# Patient Record
Sex: Male | Born: 2000 | Race: Black or African American | Hispanic: No | Marital: Single | Smoking: Never smoker
Health system: Southern US, Community
[De-identification: ages and names within clinical notes are randomized; demographics above are authoritative.]

---
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
## 2022-03-06 ENCOUNTER — Ambulatory Visit (INDEPENDENT_AMBULATORY_CARE_PROVIDER_SITE_OTHER): Payer: BLUE CROSS/BLUE SHIELD

## 2022-03-06 ENCOUNTER — Ambulatory Visit (HOSPITAL_COMMUNITY)
Admission: EM | Admit: 2022-03-06 | Discharge: 2022-03-06 | Disposition: A | Discharge status: Home / Self Care | Payer: BLUE CROSS/BLUE SHIELD | Attending: Internal Medicine | Admitting: Internal Medicine

## 2022-03-06 ENCOUNTER — Encounter (HOSPITAL_COMMUNITY): Payer: Self-pay

## 2022-03-06 DIAGNOSIS — M79641 Pain in right hand: Secondary | ICD-10-CM | POA: Diagnosis not present

## 2022-03-06 DIAGNOSIS — S60221A Contusion of right hand, initial encounter: Secondary | ICD-10-CM | POA: Diagnosis not present

## 2022-03-06 MED ORDER — IBUPROFEN 600 MG PO TABS
600.0000 mg | ORAL_TABLET | Freq: Once | ORAL | Status: AC
  Administered 2022-03-06: 600 mg via ORAL

## 2022-03-06 MED ORDER — IBUPROFEN 800 MG PO TABS
ORAL_TABLET | ORAL | Status: AC
  Filled 2022-03-06: qty 1

## 2022-03-06 MED ORDER — IBUPROFEN 600 MG PO TABS: 600.0000 mg | ORAL_TABLET | Freq: Four times a day (QID) | ORAL | 0 refills | Status: AC | PRN

## 2022-03-06 NOTE — ED Triage Notes (Signed)
Pt states punched a door with rt hand last night. Swelling noted, denies pain.

## 2022-03-06 NOTE — Discharge Instructions (Signed)
Your xray is normal, there is no fracture. I suspect your swelling is from a contusion of your hand. Please read the attached handout. Please ice your had three times daily for the next three days. You can take 600mg  ibuprofen every 6 hours as needed. Best to take with food. Follow up with PCP if any continued symptoms, or worsening symptoms occur within the next 7 days.

## 2022-03-06 NOTE — ED Provider Notes (Signed)
[facility]    CSN: [id number] Arrival date & time: 03/06/22  1524      History   Chief Complaint Chief Complaint  Patient presents with   Hand Injury    HPI Jared Hood is a 21 y.o. male.   Pleasant 21 year old male presents today due to concerns of right hand swelling.  He states between 11 and midnight last evening he got upset and punched a door.  He states his hand started swelling immediately.  He denies any excessive pain but states it feels tight.  He denies any decreased range of motion and reports full sensation.  He has not done any treatments to his hand.  He states the swelling is to his metacarpals on his fifth and fourth digit right hand only.   Hand Injury   History reviewed. No pertinent past medical history.  There are no problems to display for this patient.   History reviewed. No pertinent surgical history.     Home Medications    Prior to Admission medications   Medication Sig Start Date End Date Taking? Authorizing Provider  ibuprofen (ADVIL) 600 MG tablet Take 1 tablet (600 mg total) by mouth every 6 (six) hours as needed. Take with food 03/06/22  Yes Ivanna Kocak L, PA    Family History History reviewed. No pertinent family history.  Social History Social History   Tobacco Use   Smoking status: Never   Smokeless tobacco: Never  Substance Use Topics   Alcohol use: Not Currently   Drug use: Not Currently     Allergies   Patient has no known allergies.   Review of Systems Review of Systems  Musculoskeletal:  Positive for joint swelling (R hand).  All other systems reviewed and are negative.    Physical Exam Triage Vital Signs ED Triage Vitals  Enc Vitals Group     BP 03/06/22 1600 118/74     Pulse Rate 03/06/22 1600 66     Resp 03/06/22 1600 18     Temp 03/06/22 1600 98.7 F (37.1 C)     Temp Source 03/06/22 1600 Oral     SpO2 03/06/22 1600 98 %     Weight --      Height --      Head Circumference --       Peak Flow --      Pain Score 03/06/22 1601 0     Pain Loc --      Pain Edu? --      Excl. in GC? --    No data found.  Updated Vital Signs BP 118/74 (BP Location: Left Arm)   Pulse 66   Temp 98.7 F (37.1 C) (Oral)   Resp 18   SpO2 98%   Visual Acuity Right Eye Distance:   Left Eye Distance:   Bilateral Distance:    Right Eye Near:   Left Eye Near:    Bilateral Near:     Physical Exam Vitals and nursing note reviewed. Exam conducted with a chaperone present.  Constitutional:      General: He is not in acute distress.    Appearance: Normal appearance. He is normal weight. He is not ill-appearing or toxic-appearing.  Musculoskeletal:     Right forearm: Normal.     Left forearm: Normal.     Right wrist: Normal. No swelling, deformity, effusion, lacerations, tenderness, bony tenderness, snuff box tenderness or crepitus. Normal range of motion. Normal pulse.     Left wrist:  Normal.     Right hand: Swelling (without bruising or erythema to dorsal 4th/5th MCP only) present. No deformity, lacerations, tenderness or bony tenderness. Normal range of motion. Normal strength. Normal sensation. There is no disruption of two-point discrimination. Normal capillary refill. Normal pulse.     Left hand: Normal.  Neurological:     Mental Status: He is alert.      UC Treatments / Results  Labs (all labs ordered are listed, but only abnormal results are displayed) Labs Reviewed - No data to display  EKG   Radiology DG Hand Complete Right  Result Date: 03/06/2022 CLINICAL DATA:  Punched a door with right hand yesterday EXAM: RIGHT HAND - COMPLETE 3+ VIEW COMPARISON:  None Available. FINDINGS: Frontal, oblique, and lateral views of the right hand are obtained. No acute displaced fracture, subluxation, or dislocation. Joint spaces are well preserved. Soft tissues are normal. IMPRESSION: 1. Unremarkable right hand. Electronically Signed   By: Sharlet Salina M.D.   On: 03/06/2022  16:11    Procedures Procedures (including critical care time)  Medications Ordered in UC Medications  ibuprofen (ADVIL) tablet 600 mg (has no administration in time range)    Initial Impression / Assessment and Plan / UC Course  I have reviewed the triage vital signs and the nursing notes.  Pertinent labs & imaging results that were available during my care of the patient were reviewed by me and considered in my medical decision making (see chart for details).     Contusion R hand - xray negative. FROM and no bruising. Sx consistent with contusion. Ibuprofen given in office. Pt to ice his hand TID x 3 days, ibuprofen Q6 PRN pain/swelling.   Final Clinical Impressions(s) / UC Diagnoses   Final diagnoses:  Contusion of right hand, initial encounter     Discharge Instructions      Your xray is normal, there is no fracture. I suspect your swelling is from a contusion of your hand. Please read the attached handout. Please ice your had three times daily for the next three days. You can take 600mg  ibuprofen every 6 hours as needed. Best to take with food. Follow up with PCP if any continued symptoms, or worsening symptoms occur within the next 7 days.     ED Prescriptions     Medication Sig Dispense Auth. Provider   ibuprofen (ADVIL) 600 MG tablet Take 1 tablet (600 mg total) by mouth every 6 (six) hours as needed. Take with food 20 tablet Nikya Busler L, PA      PDMP not reviewed this encounter.   , Maretta Bees 03/06/22 1625

## 2024-08-29 ENCOUNTER — Other Ambulatory Visit: Payer: Self-pay

## 2024-08-29 ENCOUNTER — Emergency Department
Admission: EM | Admit: 2024-08-29 | Discharge: 2024-08-29 | Disposition: A | Discharge status: Home / Self Care | Payer: Self-pay

## 2024-08-29 ENCOUNTER — Emergency Department: Payer: Self-pay

## 2024-08-29 DIAGNOSIS — J9801 Acute bronchospasm: Secondary | ICD-10-CM | POA: Insufficient documentation

## 2024-08-29 DIAGNOSIS — R079 Chest pain, unspecified: Secondary | ICD-10-CM

## 2024-08-29 DIAGNOSIS — F17200 Nicotine dependence, unspecified, uncomplicated: Secondary | ICD-10-CM | POA: Insufficient documentation

## 2024-08-29 LAB — BASIC METABOLIC PANEL WITH GFR
Anion gap: 8 (ref 5–15)
BUN: 11 mg/dL (ref 6–20)
CO2: 30 mmol/L (ref 22–32)
Calcium: 9.6 mg/dL (ref 8.9–10.3)
Chloride: 104 mmol/L (ref 98–111)
Creatinine, Ser: 1.08 mg/dL (ref 0.61–1.24)
GFR, Estimated: >60 mL/min
Glucose, Bld: 121 mg/dL — ABNORMAL HIGH (ref 70–99)
Potassium: 4 mmol/L (ref 3.5–5.1)
Sodium: 142 mmol/L (ref 135–145)

## 2024-08-29 LAB — TROPONIN T, HIGH SENSITIVITY: Troponin T High Sensitivity: 7 ng/L (ref 0–19)

## 2024-08-29 LAB — CBC
HCT: 38.6 % — ABNORMAL LOW (ref 39.0–52.0)
Hemoglobin: 13.8 g/dL (ref 13.0–17.0)
MCH: 31.1 pg (ref 26.0–34.0)
MCHC: 35.8 g/dL (ref 30.0–36.0)
MCV: 86.9 fL (ref 80.0–100.0)
Platelets: 265 10*3/uL (ref 150–400)
RBC: 4.44 MIL/uL (ref 4.22–5.81)
RDW: 11.6 % (ref 11.5–15.5)
WBC: 5.4 10*3/uL (ref 4.0–10.5)
nRBC: 0 % (ref 0.0–0.2)

## 2024-08-29 MED ORDER — KETOROLAC TROMETHAMINE 15 MG/ML IJ SOLN
15.0000 mg | Freq: Once | INTRAMUSCULAR | Status: AC
  Administered 2024-08-29: 15 mg via INTRAVENOUS
  Filled 2024-08-29: qty 1

## 2024-08-29 MED ORDER — ALBUTEROL SULFATE HFA 108 (90 BASE) MCG/ACT IN AERS
2.0000 | INHALATION_SPRAY | Freq: Four times a day (QID) | RESPIRATORY_TRACT | 2 refills | Status: AC | PRN
  Filled 2024-08-29: qty 6.7, 25d supply, fill #0

## 2024-08-29 MED ORDER — IPRATROPIUM-ALBUTEROL 0.5-2.5 (3) MG/3ML IN SOLN
3.0000 mL | Freq: Once | RESPIRATORY_TRACT | Status: AC
  Administered 2024-08-29: 3 mL via RESPIRATORY_TRACT
  Filled 2024-08-29: qty 3

## 2024-08-29 NOTE — Discharge Instructions (Signed)

## 2024-08-29 NOTE — ED Triage Notes (Signed)
 Pt sts that he has been having chest pain for the last two days. Pt sts that the pain is mid sternal.

## 2024-08-29 NOTE — ED Provider Notes (Signed)
 "  [facility] Provider Note    Event Date/Time   First MD Initiated Contact with Patient 08/29/24 1057     (approximate)   History   Chest Pain   HPI  Jared Hood is a 24 y.o. male with a past medical history of vaping who presents with 2 days of worsening chest pain and shortness of breath.  He states that the pain is worse with deep breaths and is midsternal.  Denies any abdominal pain or nausea and vomiting.  Patient denies any syncopal episodes or any family history of early cardiac death or coronary artery disease      Physical Exam   Triage Vital Signs: ED Triage Vitals  Encounter Vitals Group     BP 08/29/24 1035 (!) 142/86     Girls Systolic BP Percentile --      Girls Diastolic BP Percentile --      Boys Systolic BP Percentile --      Boys Diastolic BP Percentile --      Pulse Rate 08/29/24 1035 84     Resp 08/29/24 1035 17     Temp 08/29/24 1035 98.8 F (37.1 C)     Temp Source 08/29/24 1035 Oral     SpO2 08/29/24 1035 97 %     Weight 08/29/24 1036 160 lb (72.6 kg)     Height 08/29/24 1036 6' 2 (1.88 m)     Head Circumference --      Peak Flow --      Pain Score 08/29/24 1035 5     Pain Loc --      Pain Education --      Exclude from Growth Chart --     Most recent vital signs: Vitals:   08/29/24 1200 08/29/24 1254  BP: 120/75 108/65  Pulse:  66  Resp: 17 18  Temp:    SpO2:  98%    Nursing Triage Note reviewed. Vital signs reviewed and patients oxygen saturation is normoxic  General: Patient is well nourished, well developed, awake and alert, resting comfortably in no acute distress Head: Normocephalic and atraumatic Eyes: Normal inspection, extraocular muscles intact, no conjunctival pallor Ear, nose, throat: Normal external exam Neck: Normal range of motion Respiratory: Patient is in no respiratory distress, lungs slight wheeze in the apices Cardiovascular: Patient is not tachycardic, RRR without murmur  appreciated GI: Abd SNT with no guarding or rebound  Back: Normal inspection of the back with good strength and range of motion throughout all ext Extremities: pulses intact with good cap refills, no LE pitting edema or calf tenderness Neuro: The patient is alert and oriented to person, place, and time, appropriately conversive, with 5/5 bilat UE/LE strength, no gross motor or sensory defects noted. Coordination appears to be adequate. Skin: Warm, dry, and intact Psych: normal mood and affect, no SI or HI  ED Results / Procedures / Treatments   Labs (all labs ordered are listed, but only abnormal results are displayed) Labs Reviewed  BASIC METABOLIC PANEL WITH GFR - Abnormal; Notable for the following components:      Result Value   Glucose, Bld 121 (*)    All other components within normal limits  CBC - Abnormal; Notable for the following components:   HCT 38.6 (*)    All other components within normal limits  TROPONIN T, HIGH SENSITIVITY  TROPONIN T, HIGH SENSITIVITY     EKG EKG and rhythm strip are interpreted by myself:   EKG: [  Normal sinus rhythm] at heart rate of 93, normal QRS duration, QTc 430, nonspecific ST segments and T waves no ectopy EKG not consistent with Acute STEMI Rhythm strip: NSR in lead II   RADIOLOGY Xray chest: No acute abnormality on my independent review interpretation radiologist agrees    PROCEDURES:  Critical Care performed: No  Procedures   MEDICATIONS ORDERED IN ED: Medications  ipratropium-albuterol  (DUONEB) 0.5-2.5 (3) MG/3ML nebulizer solution 3 mL (3 mLs Nebulization Given 08/29/24 1157)  ketorolac  (TORADOL ) 15 MG/ML injection 15 mg (15 mg Intravenous Given 08/29/24 1152)     IMPRESSION / MDM / ASSESSMENT AND PLAN / ED COURSE                                Differential diagnosis includes, but is not limited to, atypical ACS, PE, pneumonia, pleurisy, GERD, electrolyte derangement, anemia  ED course: Patient arrives and he is  well-appearing.  EKG demonstrates no evidence of acute ischemia.  I did consider pulmonary embolism however he is not tachycardic and by [facility] criteria is negative.  He had no leukocytosis or profound anemia.  He had no electrolyte derangements.  Chest x-ray demonstrated no acute abnormality and his troponin was not elevated despite 2 days of constant symptoms.  He did have improvement in his symptoms with a DuoNeb and Toradol .  Given this we will send him with an inhaler will but I do not think he requires any prednisone at this time.  He was counseled to follow-up with your primary care physician as soon as possible and I have placed a referral in his discharge   Clinical Course as of 08/29/24 1607  Tue Aug 29, 2024  1101 Pulse Rate: 84 [HD]  1102 Cardiac [HD]  1145 Troponin T High Sensitivity: 7 Mild elevated [HD]  1145 DG Chest 2 View No acute abnormality [HD]  1145 CBC(!) No leukocytosis [HD]  1145 Basic metabolic panel(!) No electrolyte derangements [HD]    Clinical Course User Index [HD] Jared Rolland BRAVO, MD   At time of discharge there is no evidence of acute life, limb, vision, or fertility threat. Patient has stable vital signs, pain is well controlled, patient is ambulatory and p.o. tolerant.  Discharge instructions were completed using the EPIC system. I would refer you to those at this time. All warnings prescriptions follow-up etc. were discussed in detail with the patient. Patient indicates understanding and is agreeable with this plan. All questions answered.  Patient is made aware that they may return to the emergency department for any worsening or new condition or for any other emergency.   -- Risk: 5 This patient has a high risk of morbidity due to further diagnostic testing or treatment. Rationale: This patients evaluation and management involve a high risk of morbidity due to the potential severity of presenting symptoms, need for diagnostic testing, and/or  initiation of treatment that may require close monitoring. The differential includes conditions with potential for significant deterioration or requiring escalation of care. Treatment decisions in the ED, including medication administration, procedural interventions, or disposition planning, reflect this level of risk. COPA: 5 The patient has the following acute or chronic illness/injury that poses a possible threat to life or bodily function: [X] : The patient has a potentially serious acute condition or an acute exacerbation of a chronic illness requiring urgent evaluation and management in the Emergency Department. The clinical presentation necessitates immediate consideration of life-threatening or function-threatening diagnoses, even  if they are ultimately ruled out.   FINAL CLINICAL IMPRESSION(S) / ED DIAGNOSES   Final diagnoses:  Chest pain, unspecified type  Bronchospasm  Vaping nicotine dependence, non-tobacco product     Rx / DC Orders   ED Discharge Orders          Ordered    albuterol  (VENTOLIN  HFA) 108 (90 Base) MCG/ACT inhaler  Every 6 hours PRN        08/29/24 1153    Ambulatory Referral to Primary Care (Establish Care)        08/29/24 1153             Note:  This document was prepared using Dragon voice recognition software and may include unintentional dictation errors.   Jared Rolland BRAVO, MD 08/29/24 1607  "
